# Patient Record
Sex: Male | Born: 2005 | Race: Black or African American | Hispanic: No | Marital: Single | Smoking: Never smoker
Health system: Southern US, Community
[De-identification: ages and names within clinical notes are randomized; demographics above are authoritative.]

## PROBLEM LIST (undated history)

## (undated) ENCOUNTER — Emergency Department (HOSPITAL_COMMUNITY): Admission: EM | Payer: Medicaid Other | Source: Home / Self Care

## (undated) HISTORY — PX: ADENOIDECTOMY: SUR15

## (undated) HISTORY — PX: TONSILLECTOMY: SUR1361

## (undated) HISTORY — PX: CIRCUMCISION: SUR203

---
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
## 2023-08-06 ENCOUNTER — Other Ambulatory Visit: Payer: Self-pay

## 2023-08-06 ENCOUNTER — Ambulatory Visit: Payer: Self-pay

## 2023-08-06 ENCOUNTER — Ambulatory Visit
Admission: EM | Admit: 2023-08-06 | Discharge: 2023-08-06 | Disposition: A | Discharge status: Home / Self Care | Attending: Family Medicine | Admitting: Family Medicine

## 2023-08-06 DIAGNOSIS — J029 Acute pharyngitis, unspecified: Secondary | ICD-10-CM

## 2023-08-06 LAB — POCT RAPID STREP A (OFFICE): Rapid Strep A Screen: NEGATIVE

## 2023-08-06 MED ORDER — CETIRIZINE HCL 10 MG PO TABS: ORAL_TABLET | ORAL | 2 refills | Status: AC

## 2023-08-06 MED ORDER — CETIRIZINE HCL 10 MG PO TABS: ORAL_TABLET | ORAL | 2 refills | Status: DC

## 2023-08-06 NOTE — ED Provider Notes (Signed)
 [facility]    CSN: [id number] Arrival date & time: 08/06/23  1727      History   Chief Complaint Chief Complaint  Patient presents with   Sore Throat    HPI Darren Welch is a 18 y.o. male.    Sore Throat  Sore throat for 3 days getting worse.  Denies recent fever or chills.  Admits slight cough, had a recent illness about 2 weeks ago.  Denies ear pain, headache, swollen glands, chest pain, shortness of breath, nausea, vomiting, diarrhea.  Over-the-counter allergy medicine.  No known drug allergies.  History reviewed. No pertinent past medical history.  There are no active problems to display for this patient.   Past Surgical History:  Procedure Laterality Date   ADENOIDECTOMY     CIRCUMCISION     TONSILLECTOMY         Home Medications    Prior to Admission medications   Medication Sig Start Date End Date Taking? Authorizing Provider  cetirizine (ZYRTEC) 10 MG tablet Take 1 tablet by mouth daily 07/14/21     cyclobenzaprine (FLEXERIL) 5 MG tablet Take 1 tablet (5 mg total) by mouth at bedtime as needed for muscle spasms. 08/28/22   Wallis Bamberg, PA-C  fluticasone [facility]) 50 MCG/ACT nasal spray Shake liquid and use 2 sprays in each nostril daily 07/15/21     naproxen (NAPROSYN) 375 MG tablet Take 1 tablet (375 mg total) by mouth 2 (two) times daily with a meal. 08/28/22   Wallis Bamberg, PA-C  Olopatadine HCl (EYE ALLERGY ITCH RELIEF) 0.2 % SOLN Place 1 drop into both eyes daily 07/15/21       Family History History reviewed. No pertinent family history.  Social History Social History   Tobacco Use   Smoking status: Never   Smokeless tobacco: Never  Vaping Use   Vaping status: Never Used  Substance Use Topics   Alcohol use: Never   Drug use: Never     Allergies   Patient has no known allergies.   Review of Systems Review of Systems   Physical Exam Triage Vital Signs ED Triage Vitals  Encounter Vitals Group     BP 08/06/23 1736 117/78      Systolic BP Percentile --      Diastolic BP Percentile --      Pulse Rate 08/06/23 1736 82     Resp 08/06/23 1736 16     Temp 08/06/23 1736 (!) 97.4 F (36.3 C)     Temp Source 08/06/23 1736 Oral     SpO2 08/06/23 1736 95 %     Weight 08/06/23 1737 148 lb (67.1 kg)     Height 08/06/23 1737 5\' 7"  (1.702 m)     Head Circumference --      Peak Flow --      Pain Score 08/06/23 1737 5     Pain Loc --      Pain Education --      Exclude from Growth Chart --    No data found.  Updated Vital Signs BP 117/78 (BP Location: Right Arm)   Pulse 82   Temp (!) 97.4 F (36.3 C) (Oral)   Resp 16   Ht 5\' 7"  (1.702 m)   Wt 148 lb (67.1 kg)   SpO2 95%   BMI 23.18 kg/m   Visual Acuity Right Eye Distance:   Left Eye Distance:   Bilateral Distance:    Right Eye Near:   Left Eye Near:  Bilateral Near:     Physical Exam Vitals and nursing note reviewed.  Constitutional:      Appearance: He is not ill-appearing.  HENT:     Head: Normocephalic and atraumatic.     Right Ear: Tympanic membrane and ear canal normal.     Left Ear: Tympanic membrane normal.     Nose: No rhinorrhea.     Mouth/Throat:     Mouth: Mucous membranes are moist. No oral lesions.     Pharynx: Uvula midline. No oropharyngeal exudate, posterior oropharyngeal erythema or uvula swelling.     Comments: Normal swallowing, clear voice Eyes:     Conjunctiva/sclera: Conjunctivae normal.  Cardiovascular:     Rate and Rhythm: Normal rate and regular rhythm.     Heart sounds: Normal heart sounds.  Pulmonary:     Effort: Pulmonary effort is normal. No respiratory distress.     Breath sounds: Normal breath sounds. No wheezing or rhonchi.  Musculoskeletal:     Cervical back: Neck supple.  Lymphadenopathy:     Cervical: No cervical adenopathy.  Neurological:     Mental Status: He is alert.  Psychiatric:        Mood and Affect: Mood normal.      UC Treatments / Results  Labs (all labs ordered are listed, but  only abnormal results are displayed) Labs Reviewed - No data to display  EKG   Radiology No results found.  Procedures Procedures (including critical care time)  Medications Ordered in UC Medications - No data to display  Initial Impression / Assessment and Plan / UC Course  I have reviewed the triage vital signs and the nursing notes.  Pertinent labs & imaging results that were available during my care of the patient were reviewed by me and considered in my medical decision making (see chart for details).     18 year old with sore throat for 3 days no fever, exam is normal, normal swallowing, clear voice, no evidence of peritonsillar or retropharyngeal abscess.  Point-of-care strep is negative, OTC meds for symptomatic relief follow-up as needed Patient requested refill of his cetirizine Final Clinical Impressions(s) / UC Diagnoses   Final diagnoses:  None   Discharge Instructions   None    ED Prescriptions   None    PDMP not reviewed this encounter.   Meliton Rattan, [place] 08/06/23 1754

## 2023-08-06 NOTE — ED Triage Notes (Signed)
 Pt presents with complaints of sore throat x 3 days. Denies fevers at home & denies taking OTC medications for symptoms. Pt currently rates his overall throat pain a 5/10.

## 2023-08-06 NOTE — Discharge Instructions (Signed)
 Over the counter NSAIDs for pain: Ibuprofen 400 mg ( Ibuprofen, Advil or Motrin, 2 tablets) and Acetaminophen 1 gram (3 of the regular strength or 2 extra strength tablets) 3-4 times a day,  take on an empty stomach before each meal and bedtime (every 6 hours) for a few days Do not take if you are pregnant/breastfeeding. Allergic to NSAIDs have a history of ulcers, intestinal bleeding or liver or kidney disease or have taken an opioid.

## 2024-03-10 ENCOUNTER — Ambulatory Visit
Admission: RE | Admit: 2024-03-10 | Discharge: 2024-03-10 | Disposition: A | Discharge status: Home / Self Care | Payer: Self-pay | Source: Ambulatory Visit | Attending: Nurse Practitioner | Admitting: Nurse Practitioner

## 2024-03-10 VITALS — BP 99/60 | HR 68 | Temp 97.4°F | Resp 18 | Ht 67.0 in | Wt 154.0 lb

## 2024-03-10 DIAGNOSIS — M25531 Pain in right wrist: Secondary | ICD-10-CM

## 2024-03-10 MED ORDER — NAPROXEN 500 MG PO TABS: 500.0000 mg | ORAL_TABLET | Freq: Two times a day (BID) | ORAL | 0 refills | Status: AC

## 2024-03-10 NOTE — Discharge Instructions (Signed)
 You were seen today for right wrist pain. There was no injury or fall, and your symptoms are most consistent with a repetitive strain or overuse injury. Your wrist was placed in a splint to provide support and reduce movement while it heals. You were prescribed naproxen  twice daily with food to help decrease inflammation and pain. Do not take any over-the-counter NSAIDs such as aleve , motrin or ibuprofen while taking this. If needed, you may take Tylenol (acetaminophen) 1000 mg every six hours for additional pain relief. This equals two 500 mg tablets at a time. Be careful not to take more than 4000 mg of Tylenol in a 24-hour period.  Applying ice for 15 to 20 minutes at a time, up to three times per day, can also help with discomfort and swelling. Be sure to place a towel or cloth between the ice and your skin to prevent irritation. Try to limit activities that put stress on the wrist, such as lifting, typing, or repetitive gripping, until symptoms improve. You may gently move your fingers and elbow to maintain flexibility.  Follow up with an orthopedic specialist in about 10 to 14 days if your symptoms are not improving or  if the pain returns when you stop using the splint. Seek medical attention sooner if you develop worsening pain, new swelling, redness, warmth, numbness, tingling, or loss of movement in your hand or wrist. Go to the emergency department if you are unable to move your fingers, if your hand becomes pale or cold, or if the pain becomes severe and unrelenting.

## 2024-03-10 NOTE — ED Provider Notes (Signed)
 [facility]    CSN: [id number] Arrival date & time: 03/10/24  1004      History   Chief Complaint Chief Complaint  Patient presents with   Wrist Pain    Right - Entered by patient    HPI Darren Welch is a 18 y.o. male.   Discussed the use of AI scribe software for clinical note transcription with the patient, who gave verbal consent to proceed.   The patient presents with right wrist pain that has been present for approximately one week. She denies any injury, fall, or identifiable precipitating event. The pain is localized to the wrist and occurs primarily with movement but not at rest. She denies numbness, tingling, or weakness in the fingers, hand, or wrist. No swelling has been noted. The patient has not attempted any treatments for the pain prior to this visit.  The following sections of the patient's history were reviewed and updated as appropriate: allergies, current medications, past family history, past medical history, past social history, past surgical history, and problem list.        History reviewed. No pertinent past medical history.  There are no active problems to display for this patient.   Past Surgical History:  Procedure Laterality Date   ADENOIDECTOMY     CIRCUMCISION     TONSILLECTOMY         Home Medications    Prior to Admission medications   Medication Sig Start Date End Date Taking? Authorizing Provider  naproxen  (NAPROSYN ) 500 MG tablet Take 1 tablet (500 mg total) by mouth 2 (two) times daily with a meal. Take with food to avoid stomach upset. Do not take any additional NSAIDs while on this. You may take tylenol in addition to this if needed for extra pain relief. 03/10/24  Yes Iola Lukes, FNP  cetirizine  (ZYRTEC ) 10 MG tablet Take 1 tablet by mouth daily 08/06/23 11/04/23  Acevedo, Angela, PA  fluticasone  (FLONASE ) 50 MCG/ACT nasal spray Shake liquid and use 2 sprays in each nostril daily 07/15/21     Olopatadine  HCl  (EYE ALLERGY ITCH RELIEF) 0.2 % SOLN Place 1 drop into both eyes daily 07/15/21       Family History History reviewed. No pertinent family history.  Social History Social History   Tobacco Use   Smoking status: Never   Smokeless tobacco: Never  Vaping Use   Vaping status: Never Used  Substance Use Topics   Alcohol use: Never   Drug use: Never     Allergies   Patient has no known allergies.   Review of Systems Review of Systems  Musculoskeletal:  Positive for arthralgias (right wrist). Negative for joint swelling.  Neurological:  Negative for weakness and numbness.  All other systems reviewed and are negative.    Physical Exam Triage Vital Signs ED Triage Vitals  Encounter Vitals Group     BP 03/10/24 1013 99/60     Girls Systolic BP Percentile --      Girls Diastolic BP Percentile --      Boys Systolic BP Percentile --      Boys Diastolic BP Percentile --      Pulse Rate 03/10/24 1013 68     Resp 03/10/24 1013 18     Temp 03/10/24 1013 (!) 97.4 F (36.3 C)     Temp Source 03/10/24 1013 Oral     SpO2 03/10/24 1013 96 %     Weight 03/10/24 1013 154 lb (69.9 kg)  Height 03/10/24 1013 5' 7 (1.702 m)     Head Circumference --      Peak Flow --      Pain Score 03/10/24 1022 6     Pain Loc --      Pain Education --      Exclude from Growth Chart --    No data found.  Updated Vital Signs BP 99/60 (BP Location: Right Arm)   Pulse 68   Temp (!) 97.4 F (36.3 C) (Oral)   Resp 18   Ht 5' 7 (1.702 m)   Wt 154 lb (69.9 kg)   SpO2 96%   BMI 24.12 kg/m   Visual Acuity Right Eye Distance:   Left Eye Distance:   Bilateral Distance:    Right Eye Near:   Left Eye Near:    Bilateral Near:     Physical Exam Vitals reviewed.  Constitutional:      General: He is awake. He is not in acute distress.    Appearance: Normal appearance. He is well-developed. He is not ill-appearing, toxic-appearing or diaphoretic.  HENT:     Head: Normocephalic.     Right  Ear: Hearing normal.     Left Ear: Hearing normal.     Nose: Nose normal.     Mouth/Throat:     Mouth: Mucous membranes are moist.  Eyes:     General: Vision grossly intact.     Conjunctiva/sclera: Conjunctivae normal.  Cardiovascular:     Rate and Rhythm: Normal rate and regular rhythm.     Heart sounds: Normal heart sounds.  Pulmonary:     Effort: Pulmonary effort is normal.     Breath sounds: Normal breath sounds and air entry.  Musculoskeletal:        General: Normal range of motion.     Right wrist: No swelling, deformity, effusion, tenderness, bony tenderness, snuff box tenderness or crepitus. Normal range of motion.     Right hand: Normal.     Cervical back: Normal range of motion and neck supple.  Skin:    General: Skin is warm and dry.  Neurological:     General: No focal deficit present.     Mental Status: He is alert and oriented to person, place, and time.     Cranial Nerves: No cranial nerve deficit.     Sensory: Sensation is intact. No sensory deficit.     Motor: Motor function is intact.     Coordination: Coordination is intact.  Psychiatric:        Speech: Speech normal.        Behavior: Behavior is cooperative.      UC Treatments / Results  Labs (all labs ordered are listed, but only abnormal results are displayed) Labs Reviewed - No data to display  EKG   Radiology No results found.  Procedures Procedures (including critical care time)  Medications Ordered in UC Medications - No data to display  Initial Impression / Assessment and Plan / UC Course  I have reviewed the triage vital signs and the nursing notes.  Pertinent labs & imaging results that were available during my care of the patient were reviewed by me and considered in my medical decision making (see chart for details).     The patient presents with right wrist pain for the past week without history of trauma or identifiable injury. Pain is activity-related and absent at rest,  with no associated swelling, numbness, or weakness. Findings are most consistent with a  repetitive strain or overuse injury of the wrist. A wrist splint was applied for immobilization and support. The patient was advised to apply ice three times daily for 20 minutes using a towel barrier and to take naproxen  twice daily with food to reduce inflammation and discomfort. Follow-up with orthopedics is recommended in 10-14 days if symptoms do not improve or worsen. The patient was advised to seek earlier evaluation by their primary care provider or emergency care for increasing pain, swelling, redness, numbness, or decreased range of motion.  Today's evaluation has revealed no signs of a dangerous process. Discussed diagnosis with patient and/or guardian. Patient and/or guardian aware of their diagnosis, possible red flag symptoms to watch out for and need for close follow up. Patient and/or guardian understands verbal and written discharge instructions. Patient and/or guardian comfortable with plan and disposition.  Patient and/or guardian has a clear mental status at this time, good insight into illness (after discussion and teaching) and has clear judgment to make decisions regarding their care  Documentation was completed with the aid of voice recognition software. Transcription may contain typographical errors.  Final Clinical Impressions(s) / UC Diagnoses   Final diagnoses:  Right wrist pain     Discharge Instructions      You were seen today for right wrist pain. There was no injury or fall, and your symptoms are most consistent with a repetitive strain or overuse injury. Your wrist was placed in a splint to provide support and reduce movement while it heals. You were prescribed naproxen  twice daily with food to help decrease inflammation and pain. Do not take any over-the-counter NSAIDs such as aleve , motrin or ibuprofen while taking this. If needed, you may take Tylenol (acetaminophen) 1000 mg  every six hours for additional pain relief. This equals two 500 mg tablets at a time. Be careful not to take more than 4000 mg of Tylenol in a 24-hour period.  Applying ice for 15 to 20 minutes at a time, up to three times per day, can also help with discomfort and swelling. Be sure to place a towel or cloth between the ice and your skin to prevent irritation. Try to limit activities that put stress on the wrist, such as lifting, typing, or repetitive gripping, until symptoms improve. You may gently move your fingers and elbow to maintain flexibility.  Follow up with an orthopedic specialist in about 10 to 14 days if your symptoms are not improving or  if the pain returns when you stop using the splint. Seek medical attention sooner if you develop worsening pain, new swelling, redness, warmth, numbness, tingling, or loss of movement in your hand or wrist. Go to the emergency department if you are unable to move your fingers, if your hand becomes pale or cold, or if the pain becomes severe and unrelenting.     ED Prescriptions     Medication Sig Dispense Auth. Provider   naproxen  (NAPROSYN ) 500 MG tablet Take 1 tablet (500 mg total) by mouth 2 (two) times daily with a meal. Take with food to avoid stomach upset. Do not take any additional NSAIDs while on this. You may take tylenol in addition to this if needed for extra pain relief. 20 tablet Iola Lukes, FNP      PDMP not reviewed this encounter.   Iola Lukes, [place] 03/10/24 1042

## 2024-03-10 NOTE — ED Triage Notes (Addendum)
 Pt c/o right wrist pain for 1 week. Denies any known injury recently States he has broken this wrist before in past but didn't need surgery  States he is going to [employer] in a few weeks and wants to get it check before then

## 2024-06-10 ENCOUNTER — Ambulatory Visit: Payer: Self-pay | Admitting: Allergy
